# Patient Record
Sex: Male | Born: 1975 | Race: White | Hispanic: No | Marital: Married | Smoking: Former smoker
Health system: Southern US, Community
[De-identification: ages and names within clinical notes are randomized; demographics above are authoritative.]

## PROBLEM LIST (undated history)

## (undated) DIAGNOSIS — R011 Cardiac murmur, unspecified: Secondary | ICD-10-CM

## (undated) HISTORY — PX: LEG SURGERY: SHX1003

---

## 2015-07-29 HISTORY — PX: OTHER SURGICAL HISTORY: SHX169

## 2015-12-08 ENCOUNTER — Encounter (HOSPITAL_COMMUNITY)
Admission: RE | Admit: 2015-12-08 | Discharge: 2015-12-08 | Disposition: A | Discharge status: Home / Self Care | Payer: Worker's Compensation | Source: Ambulatory Visit | Attending: Orthopedic Surgery | Admitting: Orthopedic Surgery

## 2015-12-08 ENCOUNTER — Encounter (HOSPITAL_COMMUNITY): Payer: Self-pay

## 2015-12-08 DIAGNOSIS — S83242A Other tear of medial meniscus, current injury, left knee, initial encounter: Secondary | ICD-10-CM | POA: Diagnosis not present

## 2015-12-08 DIAGNOSIS — M2342 Loose body in knee, left knee: Secondary | ICD-10-CM | POA: Diagnosis not present

## 2015-12-08 DIAGNOSIS — Z87891 Personal history of nicotine dependence: Secondary | ICD-10-CM | POA: Diagnosis not present

## 2015-12-08 DIAGNOSIS — M25562 Pain in left knee: Secondary | ICD-10-CM | POA: Diagnosis not present

## 2015-12-08 HISTORY — DX: Cardiac murmur, unspecified: R01.1

## 2015-12-08 LAB — CBC
HEMATOCRIT: 42.5 % (ref 39.0–52.0)
Hemoglobin: 14.6 g/dL (ref 13.0–17.0)
MCH: 30.9 pg (ref 26.0–34.0)
MCHC: 34.4 g/dL (ref 30.0–36.0)
MCV: 89.9 fL (ref 78.0–100.0)
PLATELETS: 208 10*3/uL (ref 150–400)
RBC: 4.73 MIL/uL (ref 4.22–5.81)
RDW: 12.3 % (ref 11.5–15.5)
WBC: 8.9 10*3/uL (ref 4.0–10.5)

## 2015-12-08 NOTE — Pre-Procedure Instructions (Signed)
Isaac Rich  12/08/2015      [id number] - [place], [place] - [address] AT [facility] [address] [place] [address] Phone: [phone] Fax: [phone]    Your procedure is scheduled on Friday August 11.  Report to [facility] Admitting at 9:30 A.M.  Call this number if you have problems the morning of surgery:  [phone]   Remember:  Do not eat food or drink liquids after midnight.  Take these medicines the morning of surgery with A SIP OF WATER: NONE  7 days prior to surgery STOP taking any Aspirin, Aleve, Naproxen, Ibuprofen, Motrin, Advil, Goody's, BC's, all herbal medications, fish oil, and all vitamins    Do not wear jewelry, make-up or nail polish.  Do not wear lotions, powders, or perfumes.  You may wear deoderant.  Do not shave 48 hours prior to surgery.    Do not bring valuables to the hospital.  [facility] is not responsible for any belongings or valuables.  Contacts, dentures or bridgework may not be worn into surgery.  Leave your suitcase in the car.  After surgery it may be brought to your room.  For patients admitted to the hospital, discharge time will be determined by your treatment team.  Patients discharged the day of surgery will not be allowed to drive home.   Special instructions:    [facility]- Preparing For Surgery  Before surgery, you can play an important role. Because skin is not sterile, your skin needs to be as free of germs as possible. You can reduce the number of germs on your skin by washing with CHG (chlorahexidine gluconate) Soap before surgery.  CHG is an antiseptic cleaner which kills germs and bonds with the skin to continue killing germs even after washing.  Please do not use if you have an allergy to CHG or antibacterial soaps. If your skin becomes reddened/irritated stop using the CHG.  Do not shave (including legs and underarms) for at least 48 hours prior to first CHG shower. It is  OK to shave your face.  Please follow these instructions carefully.   1. Shower the NIGHT BEFORE SURGERY and the MORNING OF SURGERY with CHG.   2. If you chose to wash your hair, wash your hair first as usual with your normal shampoo.  3. After you shampoo, rinse your hair and body thoroughly to remove the shampoo.  4. Use CHG as you would any other liquid soap. You can apply CHG directly to the skin and wash gently with a scrungie or a clean washcloth.   5. Apply the CHG Soap to your body ONLY FROM THE NECK DOWN.  Do not use on open wounds or open sores. Avoid contact with your eyes, ears, mouth and genitals (private parts). Wash genitals (private parts) with your normal soap.  6. Wash thoroughly, paying special attention to the area where your surgery will be performed.  7. Thoroughly rinse your body with warm water from the neck down.  8. DO NOT shower/wash with your normal soap after using and rinsing off the CHG Soap.  9. Pat yourself dry with a CLEAN TOWEL.   10. Wear CLEAN PAJAMAS   11. Place CLEAN SHEETS on your bed the night of your first shower and DO NOT SLEEP WITH PETS.    Day of Surgery: Do not apply any deodorants/lotions. Please wear clean clothes to the hospital/surgery center.      Please  read over the following fact sheets that you were given. Surgical Site Infection Prevention

## 2015-12-09 MED ORDER — CEFAZOLIN SODIUM-DEXTROSE 2-4 GM/100ML-% IV SOLN
2.0000 g | INTRAVENOUS | Status: AC
  Administered 2015-12-10: 2 g via INTRAVENOUS
  Filled 2015-12-09: qty 100

## 2015-12-10 ENCOUNTER — Ambulatory Visit (HOSPITAL_COMMUNITY)
Admission: RE | Admit: 2015-12-10 | Discharge: 2015-12-10 | Disposition: A | Discharge status: Home / Self Care | Payer: Worker's Compensation | Source: Ambulatory Visit | Attending: Orthopedic Surgery | Admitting: Orthopedic Surgery

## 2015-12-10 ENCOUNTER — Encounter (HOSPITAL_COMMUNITY): Payer: Self-pay | Admitting: *Deleted

## 2015-12-10 ENCOUNTER — Ambulatory Visit (HOSPITAL_COMMUNITY): Payer: Worker's Compensation

## 2015-12-10 ENCOUNTER — Inpatient Hospital Stay (HOSPITAL_COMMUNITY): Payer: Worker's Compensation | Admitting: Anesthesiology

## 2015-12-10 ENCOUNTER — Encounter (HOSPITAL_COMMUNITY)
Admission: RE | Disposition: A | Discharge status: Home / Self Care | Payer: Self-pay | Source: Ambulatory Visit | Attending: Orthopedic Surgery

## 2015-12-10 DIAGNOSIS — Z419 Encounter for procedure for purposes other than remedying health state, unspecified: Secondary | ICD-10-CM

## 2015-12-10 DIAGNOSIS — M2342 Loose body in knee, left knee: Secondary | ICD-10-CM | POA: Insufficient documentation

## 2015-12-10 DIAGNOSIS — Z87891 Personal history of nicotine dependence: Secondary | ICD-10-CM | POA: Insufficient documentation

## 2015-12-10 DIAGNOSIS — S83242A Other tear of medial meniscus, current injury, left knee, initial encounter: Secondary | ICD-10-CM | POA: Diagnosis not present

## 2015-12-10 DIAGNOSIS — M25562 Pain in left knee: Secondary | ICD-10-CM | POA: Insufficient documentation

## 2015-12-10 HISTORY — PX: HARDWARE REMOVAL: SHX979

## 2015-12-10 HISTORY — PX: KNEE ARTHROSCOPY: SHX127

## 2015-12-10 SURGERY — ARTHROSCOPY, KNEE
Anesthesia: General | Laterality: Left

## 2015-12-10 MED ORDER — HYDROCODONE-ACETAMINOPHEN 5-325 MG PO TABS: 1.0000 | ORAL_TABLET | Freq: Four times a day (QID) | ORAL | 0 refills | Status: AC | PRN

## 2015-12-10 MED ORDER — FENTANYL CITRATE (PF) 100 MCG/2ML IJ SOLN
INTRAMUSCULAR | Status: DC | PRN
  Administered 2015-12-10: 50 ug via INTRAVENOUS
  Administered 2015-12-10 (×2): 100 ug via INTRAVENOUS

## 2015-12-10 MED ORDER — 0.9 % SODIUM CHLORIDE (POUR BTL) OPTIME
TOPICAL | Status: DC | PRN
Start: 2015-12-10 — End: 2015-12-10
  Administered 2015-12-10: 1000 mL

## 2015-12-10 MED ORDER — ROCURONIUM BROMIDE 100 MG/10ML IV SOLN
INTRAVENOUS | Status: DC | PRN
  Administered 2015-12-10: 60 mg via INTRAVENOUS
  Administered 2015-12-10: 20 mg via INTRAVENOUS

## 2015-12-10 MED ORDER — ROCURONIUM BROMIDE 10 MG/ML (PF) SYRINGE
PREFILLED_SYRINGE | INTRAVENOUS | Status: AC
  Filled 2015-12-10: qty 10

## 2015-12-10 MED ORDER — KETOROLAC TROMETHAMINE 10 MG PO TABS: 10.0000 mg | ORAL_TABLET | Freq: Four times a day (QID) | ORAL | 0 refills | Status: AC | PRN

## 2015-12-10 MED ORDER — MIDAZOLAM HCL 2 MG/2ML IJ SOLN
INTRAMUSCULAR | Status: AC
  Filled 2015-12-10: qty 2

## 2015-12-10 MED ORDER — PROPOFOL 10 MG/ML IV BOLUS
INTRAVENOUS | Status: AC
  Filled 2015-12-10: qty 20

## 2015-12-10 MED ORDER — LIDOCAINE 2% (20 MG/ML) 5 ML SYRINGE
INTRAMUSCULAR | Status: AC
  Filled 2015-12-10: qty 5

## 2015-12-10 MED ORDER — HYDROMORPHONE HCL 1 MG/ML IJ SOLN
INTRAMUSCULAR | Status: AC
  Administered 2015-12-10: 0.5 mg via INTRAVENOUS
  Filled 2015-12-10: qty 2

## 2015-12-10 MED ORDER — FENTANYL CITRATE (PF) 100 MCG/2ML IJ SOLN: 25.0000 ug | INTRAMUSCULAR | Status: DC | PRN

## 2015-12-10 MED ORDER — MIDAZOLAM HCL 5 MG/5ML IJ SOLN
INTRAMUSCULAR | Status: DC | PRN
  Administered 2015-12-10: 2 mg via INTRAVENOUS

## 2015-12-10 MED ORDER — BUPIVACAINE-EPINEPHRINE (PF) 0.25% -1:200000 IJ SOLN
INTRAMUSCULAR | Status: AC
  Filled 2015-12-10: qty 30

## 2015-12-10 MED ORDER — ONDANSETRON HCL 4 MG/2ML IJ SOLN
INTRAMUSCULAR | Status: DC | PRN
  Administered 2015-12-10: 4 mg via INTRAVENOUS

## 2015-12-10 MED ORDER — BUPIVACAINE-EPINEPHRINE 0.25% -1:200000 IJ SOLN
INTRAMUSCULAR | Status: DC | PRN
  Administered 2015-12-10: 20 mL

## 2015-12-10 MED ORDER — FENTANYL CITRATE (PF) 250 MCG/5ML IJ SOLN
INTRAMUSCULAR | Status: AC
  Filled 2015-12-10: qty 5

## 2015-12-10 MED ORDER — DEXAMETHASONE SODIUM PHOSPHATE 10 MG/ML IJ SOLN
INTRAMUSCULAR | Status: DC | PRN
  Administered 2015-12-10: 10 mg via INTRAVENOUS

## 2015-12-10 MED ORDER — GLYCOPYRROLATE 0.2 MG/ML IJ SOLN
INTRAMUSCULAR | Status: DC | PRN
Start: 2015-12-10 — End: 2015-12-10
  Administered 2015-12-10: 0.4 mg via INTRAVENOUS

## 2015-12-10 MED ORDER — DEXAMETHASONE SODIUM PHOSPHATE 10 MG/ML IJ SOLN
INTRAMUSCULAR | Status: AC
  Filled 2015-12-10: qty 1

## 2015-12-10 MED ORDER — PROMETHAZINE HCL 25 MG/ML IJ SOLN: 6.2500 mg | INTRAMUSCULAR | Status: DC | PRN

## 2015-12-10 MED ORDER — CHLORHEXIDINE GLUCONATE 4 % EX LIQD: 60.0000 mL | Freq: Once | CUTANEOUS | Status: DC

## 2015-12-10 MED ORDER — HYDROMORPHONE HCL 1 MG/ML IJ SOLN
0.5000 mg | INTRAMUSCULAR | Status: DC | PRN
  Administered 2015-12-10 (×2): 0.5 mg via INTRAVENOUS

## 2015-12-10 MED ORDER — PROMETHAZINE HCL 12.5 MG PO TABS
12.5000 mg | ORAL_TABLET | Freq: Four times a day (QID) | ORAL | 0 refills | Status: AC | PRN
Start: 2015-12-10 — End: ?

## 2015-12-10 MED ORDER — STERILE WATER FOR IRRIGATION IR SOLN
Status: DC | PRN
  Administered 2015-12-10: 1000 mL

## 2015-12-10 MED ORDER — NEOSTIGMINE METHYLSULFATE 10 MG/10ML IV SOLN
INTRAVENOUS | Status: DC | PRN
  Administered 2015-12-10: 3 mg via INTRAVENOUS

## 2015-12-10 MED ORDER — LACTATED RINGERS IV SOLN
INTRAVENOUS | Status: DC
  Administered 2015-12-10 (×2): via INTRAVENOUS

## 2015-12-10 MED ORDER — PROPOFOL 10 MG/ML IV BOLUS
INTRAVENOUS | Status: DC | PRN
  Administered 2015-12-10: 200 mg via INTRAVENOUS

## 2015-12-10 MED ORDER — SODIUM CHLORIDE 0.9 % IR SOLN
Status: DC | PRN
  Administered 2015-12-10 (×2): 3000 mL

## 2015-12-10 MED ORDER — LIDOCAINE HCL (CARDIAC) 20 MG/ML IV SOLN
INTRAVENOUS | Status: DC | PRN
  Administered 2015-12-10: 80 mg via INTRAVENOUS

## 2015-12-10 MED ORDER — ONDANSETRON HCL 4 MG/2ML IJ SOLN
INTRAMUSCULAR | Status: AC
  Filled 2015-12-10: qty 2

## 2015-12-10 SURGICAL SUPPLY — 78 items
BANDAGE ACE 6X5 VEL STRL LF (GAUZE/BANDAGES/DRESSINGS) ×3 IMPLANT
BANDAGE ELASTIC 4 VELCRO ST LF (GAUZE/BANDAGES/DRESSINGS) ×3 IMPLANT
BANDAGE ELASTIC 6 VELCRO ST LF (GAUZE/BANDAGES/DRESSINGS) ×6 IMPLANT
BANDAGE ESMARK 6X9 LF (GAUZE/BANDAGES/DRESSINGS) ×1 IMPLANT
BIT DRILL AO GAMMA 4.2X180 (BIT) ×3 IMPLANT
BLADE CUDA 5.5 (BLADE) IMPLANT
BLADE GREAT WHITE 4.2 (BLADE) ×2 IMPLANT
BLADE GREAT WHITE 4.2MM (BLADE) ×1
BLADE SURG 11 STRL SS (BLADE) ×3 IMPLANT
BNDG COHESIVE 6X5 TAN STRL LF (GAUZE/BANDAGES/DRESSINGS) ×3 IMPLANT
BNDG ESMARK 6X9 LF (GAUZE/BANDAGES/DRESSINGS) ×3
BNDG GAUZE ELAST 4 BULKY (GAUZE/BANDAGES/DRESSINGS) ×6 IMPLANT
BRUSH SCRUB DISP (MISCELLANEOUS) ×6 IMPLANT
CLEANER TIP ELECTROSURG 2X2 (MISCELLANEOUS) IMPLANT
CLOSURE WOUND 1/2 X4 (GAUZE/BANDAGES/DRESSINGS)
COVER SURGICAL LIGHT HANDLE (MISCELLANEOUS) ×3 IMPLANT
CUFF TOURNIQUET SINGLE 34IN LL (TOURNIQUET CUFF) ×3 IMPLANT
DRAPE ARTHROSCOPY W/POUCH 114 (DRAPES) ×3 IMPLANT
DRAPE C-ARM 42X72 X-RAY (DRAPES) ×3 IMPLANT
DRAPE C-ARMOR (DRAPES) ×3 IMPLANT
DRAPE INCISE IOBAN 66X45 STRL (DRAPES) ×3 IMPLANT
DRAPE OEC MINIVIEW 54X84 (DRAPES) IMPLANT
DRAPE U-SHAPE 47X51 STRL (DRAPES) ×3 IMPLANT
DRSG ADAPTIC 3X8 NADH LF (GAUZE/BANDAGES/DRESSINGS) ×3 IMPLANT
DRSG EMULSION OIL 3X3 NADH (GAUZE/BANDAGES/DRESSINGS) ×3 IMPLANT
DRSG PAD ABDOMINAL 8X10 ST (GAUZE/BANDAGES/DRESSINGS) ×3 IMPLANT
ELECT REM PT RETURN 9FT ADLT (ELECTROSURGICAL) ×3
ELECTRODE REM PT RTRN 9FT ADLT (ELECTROSURGICAL) ×1 IMPLANT
EVACUATOR 1/8 PVC DRAIN (DRAIN) IMPLANT
GAUZE SPONGE 4X4 12PLY STRL (GAUZE/BANDAGES/DRESSINGS) ×3 IMPLANT
GAUZE SPONGE 4X4 16PLY XRAY LF (GAUZE/BANDAGES/DRESSINGS) ×3 IMPLANT
GLOVE BIO SURGEON STRL SZ7.5 (GLOVE) ×3 IMPLANT
GLOVE BIO SURGEON STRL SZ8 (GLOVE) ×3 IMPLANT
GLOVE BIOGEL PI IND STRL 7.5 (GLOVE) ×1 IMPLANT
GLOVE BIOGEL PI IND STRL 8 (GLOVE) ×1 IMPLANT
GLOVE BIOGEL PI INDICATOR 7.5 (GLOVE) ×2
GLOVE BIOGEL PI INDICATOR 8 (GLOVE) ×2
GOWN STRL REUS W/ TWL LRG LVL3 (GOWN DISPOSABLE) ×2 IMPLANT
GOWN STRL REUS W/ TWL XL LVL3 (GOWN DISPOSABLE) ×1 IMPLANT
GOWN STRL REUS W/TWL LRG LVL3 (GOWN DISPOSABLE) ×4
GOWN STRL REUS W/TWL XL LVL3 (GOWN DISPOSABLE) ×2
KIT BASIN OR (CUSTOM PROCEDURE TRAY) ×3 IMPLANT
KIT ROOM TURNOVER OR (KITS) ×3 IMPLANT
MANIFOLD NEPTUNE II (INSTRUMENTS) ×3 IMPLANT
MARKER SKIN DUAL TIP RULER LAB (MISCELLANEOUS) ×3 IMPLANT
NEEDLE 22X1 1/2 (OR ONLY) (NEEDLE) IMPLANT
NS IRRIG 1000ML POUR BTL (IV SOLUTION) ×3 IMPLANT
PACK ARTHROSCOPY DSU (CUSTOM PROCEDURE TRAY) ×3 IMPLANT
PACK ORTHO EXTREMITY (CUSTOM PROCEDURE TRAY) ×3 IMPLANT
PAD ARMBOARD 7.5X6 YLW CONV (MISCELLANEOUS) ×6 IMPLANT
PADDING CAST COTTON 6X4 STRL (CAST SUPPLIES) ×3 IMPLANT
SET ARTHROSCOPY TUBING (MISCELLANEOUS) ×2
SET ARTHROSCOPY TUBING LN (MISCELLANEOUS) ×1 IMPLANT
SPONGE GAUZE 4X4 12PLY STER LF (GAUZE/BANDAGES/DRESSINGS) ×3 IMPLANT
SPONGE LAP 18X18 X RAY DECT (DISPOSABLE) ×6 IMPLANT
SPONGE SCRUB IODOPHOR (GAUZE/BANDAGES/DRESSINGS) ×3 IMPLANT
STAPLER VISISTAT 35W (STAPLE) ×3 IMPLANT
STOCKINETTE IMPERVIOUS LG (DRAPES) IMPLANT
STRIP CLOSURE SKIN 1/2X4 (GAUZE/BANDAGES/DRESSINGS) IMPLANT
SUCTION FRAZIER HANDLE 10FR (MISCELLANEOUS)
SUCTION TUBE FRAZIER 10FR DISP (MISCELLANEOUS) IMPLANT
SUT ETHILON 3 0 PS 1 (SUTURE) ×3 IMPLANT
SUT ETHILON 4 0 PS 2 18 (SUTURE) ×3 IMPLANT
SUT PDS AB 2-0 CT1 27 (SUTURE) IMPLANT
SUT VIC AB 0 CT1 27 (SUTURE)
SUT VIC AB 0 CT1 27XBRD ANBCTR (SUTURE) IMPLANT
SUT VIC AB 2-0 CT1 27 (SUTURE)
SUT VIC AB 2-0 CT1 TAPERPNT 27 (SUTURE) IMPLANT
SYR BULB IRRIGATION 50ML (SYRINGE) ×3 IMPLANT
SYR CONTROL 10ML LL (SYRINGE) IMPLANT
TOWEL OR 17X24 6PK STRL BLUE (TOWEL DISPOSABLE) ×6 IMPLANT
TOWEL OR 17X26 10 PK STRL BLUE (TOWEL DISPOSABLE) ×6 IMPLANT
TUBE CONNECTING 12'X1/4 (SUCTIONS)
TUBE CONNECTING 12X1/4 (SUCTIONS) IMPLANT
UNDERPAD 30X30 INCONTINENT (UNDERPADS AND DIAPERS) ×6 IMPLANT
WAND HAND CNTRL MULTIVAC 90 (MISCELLANEOUS) IMPLANT
WATER STERILE IRR 1000ML POUR (IV SOLUTION) ×3 IMPLANT
YANKAUER SUCT BULB TIP NO VENT (SUCTIONS) ×3 IMPLANT

## 2015-12-10 NOTE — Progress Notes (Signed)
Orthopedic Tech Progress Note Patient Details:  Isaac Rich 12/10/1975 [id number]  Ortho Devices Type of Ortho Device: Crutches Ortho Device/Splint Interventions: Ordered, Adjustment   Isaac Rich, Isaac Rich 12/10/2015, 5:00 PM

## 2015-12-10 NOTE — Anesthesia Preprocedure Evaluation (Addendum)
Anesthesia Evaluation  Patient identified by MRN, date of birth, ID band Patient awake    Reviewed: Allergy & Precautions, NPO status , Patient's Chart, lab work & pertinent test results  History of Anesthesia Complications Negative for: history of anesthetic complications  Airway Mallampati: III  TM Distance: >3 FB Neck ROM: Full    Dental  (+) Dental Advisory Given Poor dentition. No loose teeth.:   Pulmonary neg shortness of breath, neg sleep apnea, neg COPD, neg recent URI, former smoker,    Pulmonary exam normal breath sounds clear to auscultation       Cardiovascular Exercise Tolerance: Good (-) hypertension(-) angina(-) CAD, (-) Past MI, (-) Cardiac Stents, (-) CABG and (-) Orthopnea  Rhythm:Regular Rate:Normal - Systolic murmurs H/o heart murmur as a child. No longer present.   Neuro/Psych negative neurological ROS     GI/Hepatic negative GI ROS, Neg liver ROS,   Endo/Other  negative endocrine ROS  Renal/GU negative Renal ROS     Musculoskeletal   Abdominal (+) - obese,   Peds  Hematology negative hematology ROS (+)   Anesthesia Other Findings   Reproductive/Obstetrics                            Anesthesia Physical Anesthesia Plan  ASA: I  Anesthesia Plan: General   Post-op Pain Management:    Induction: Intravenous  Airway Management Planned: Oral ETT  Additional Equipment:   Intra-op Plan:   Post-operative Plan: Extubation in OR  Informed Consent: I have reviewed the patients History and Physical, chart, labs and discussed the procedure including the risks, benefits and alternatives for the proposed anesthesia with the patient or authorized representative who has indicated his/her understanding and acceptance.   Dental advisory given  Plan Discussed with:   Anesthesia Plan Comments:       Anesthesia Quick Evaluation

## 2015-12-10 NOTE — Transfer of Care (Signed)
Immediate Anesthesia Transfer of Care Note  Patient: Isaac Rich  Procedure(s) Performed: Procedure(s): ARTHROSCOPY KNEE (Left) HARDWARE REMOVAL LEFT FEMUR (Left)  Patient Location: PACU  Anesthesia Type:General  Level of Consciousness: awake, alert , oriented and patient cooperative  Airway & Oxygen Therapy: Patient Spontanous Breathing and Patient connected to nasal cannula oxygen  Post-op Assessment: Report given to RN and Post -op Vital signs reviewed and stable  Post vital signs: Reviewed and stable  Last Vitals:  Vitals:   12/10/15 0945  BP: 129/67  Pulse: 60  Resp: 18  Temp: 36.7 C    Last Pain:  Vitals:   12/10/15 0945  TempSrc: Oral         Complications: No apparent anesthesia complications

## 2015-12-10 NOTE — Anesthesia Procedure Notes (Signed)
Procedure Name: Intubation Date/Time: 12/10/2015 1:10 PM Performed by: Army FossaPULLIAM, Zebulin Siegel DANE Pre-anesthesia Checklist: Patient identified, Emergency Drugs available, Suction available and Patient being monitored Patient Re-evaluated:Patient Re-evaluated prior to inductionOxygen Delivery Method: Circle System Utilized Preoxygenation: Pre-oxygenation with 100% oxygen Intubation Type: IV induction Ventilation: Mask ventilation without difficulty Laryngoscope Size: Mac and 4 Grade View: Grade II Tube type: Oral Tube size: 7.0 mm Number of attempts: 1 Airway Equipment and Method: Stylet and Oral airway Placement Confirmation: ETT inserted through vocal cords under direct vision,  positive ETCO2 and breath sounds checked- equal and bilateral Secured at: 23 cm Tube secured with: Tape Dental Injury: Teeth and Oropharynx as per pre-operative assessment
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
## 2015-12-10 NOTE — Discharge Instructions (Signed)
Orthopaedic Trauma Service Discharge Instructions   General Discharge Instructions  WEIGHT BEARING STATUS: weightbearing as tolerated left lower extremity   RANGE OF MOTION/ACTIVITY: range of motion as tolerated Left knee and hip   Wound Care: daily dressing changes starting on 12/12/2015. See instructions below    Discharge Wound Care Instructions  Do NOT apply any ointments, solutions or lotions to pin sites or surgical wounds.  These prevent needed drainage and even though solutions like hydrogen peroxide kill bacteria, they also damage cells lining the pin sites that help fight infection.  Applying lotions or ointments can keep the wounds moist and can cause them to breakdown and open up as well. This can increase the risk for infection. When in doubt call the office.  Surgical incisions should be dressed daily.  If any drainage is noted, use one layer of adaptic, then gauze, Kerlix, and an ace wrap.  Once the incision is completely dry and without drainage, it may be left open to air out.  Showering may begin 36-48 hours later.  Cleaning gently with soap and water.  Traumatic wounds should be dressed daily as well.    One layer of adaptic, gauze, Kerlix, then ace wrap.  The adaptic can be discontinued once the draining has ceased    If you have a wet to dry dressing: wet the gauze with saline the squeeze as much saline out so the gauze is moist (not soaking wet), place moistened gauze over wound, then place a dry gauze over the moist one, followed by Kerlix wrap, then ace wrap.   PAIN MEDICATION USE AND EXPECTATIONS  You have likely been given narcotic medications to help control your pain.  After a traumatic event that results in an fracture (broken bone) with or without surgery, it is ok to use narcotic pain medications to help control one's pain.  We understand that everyone responds to pain differently and each individual patient will be evaluated on a regular basis for the  continued need for narcotic medications. Ideally, narcotic medication use should last no more than 6-8 weeks (coinciding with fracture healing).   As a patient it is your responsibility as well to monitor narcotic medication use and report the amount and frequency you use these medications when you come to your office visit.   We would also advise that if you are using narcotic medications, you should take a dose prior to therapy to maximize you participation.  IF YOU ARE ON NARCOTIC MEDICATIONS IT IS NOT PERMISSIBLE TO OPERATE A MOTOR VEHICLE (MOTORCYCLE/CAR/TRUCK/MOPED) OR HEAVY MACHINERY DO NOT MIX NARCOTICS WITH OTHER CNS (CENTRAL NERVOUS SYSTEM) DEPRESSANTS SUCH AS ALCOHOL  Diet: as you were eating previously.  Can use over the counter stool softeners and bowel preparations, such as Miralax, to help with bowel movements.  Narcotics can be constipating.  Be sure to drink plenty of fluids    STOP SMOKING OR USING NICOTINE PRODUCTS!!!!  As discussed nicotine severely impairs your body's ability to heal surgical and traumatic wounds but also impairs bone healing.  Wounds and bone heal by forming microscopic blood vessels (angiogenesis) and nicotine is a vasoconstrictor (essentially, shrinks blood vessels).  Therefore, if vasoconstriction occurs to these microscopic blood vessels they essentially disappear and are unable to deliver necessary nutrients to the healing tissue.  This is one modifiable factor that you can do to dramatically increase your chances of healing your injury.    (This means no smoking, no nicotine gum, patches, etc)  DO NOT USE  NONSTEROIDAL ANTI-INFLAMMATORY DRUGS (NSAID'S)  Using products such as Advil (ibuprofen), Aleve (naproxen), Motrin (ibuprofen) for additional pain control during fracture healing can delay and/or prevent the healing response.  If you would like to take over the counter (OTC) medication, Tylenol (acetaminophen) is ok.  However, some narcotic medications  that are given for pain control contain acetaminophen as well. Therefore, you should not exceed more than 4000 mg of tylenol in a day if you do not have liver disease.  Also note that there are may OTC medicines, such as cold medicines and allergy medicines that my contain tylenol as well.  If you have any questions about medications and/or interactions please ask your doctor/PA or your pharmacist.      ICE AND ELEVATE INJURED/OPERATIVE EXTREMITY  Using ice and elevating the injured extremity above your heart can help with swelling and pain control.  Icing in a pulsatile fashion, such as 20 minutes on and 20 minutes off, can be followed.    Do not place ice directly on skin. Make sure there is a barrier between to skin and the ice pack.    Using frozen items such as frozen peas works well as the conform nicely to the are that needs to be iced.  USE AN ACE WRAP OR TED HOSE FOR SWELLING CONTROL  In addition to icing and elevation, Ace wraps or TED hose are used to help limit and resolve swelling.  It is recommended to use Ace wraps or TED hose until you are informed to stop.    When using Ace Wraps start the wrapping distally (farthest away from the body) and wrap proximally (closer to the body)   Example: If you had surgery on your leg or thing and you do not have a splint on, start the ace wrap at the toes and work your way up to the thigh        If you had surgery on your upper extremity and do not have a splint on, start the ace wrap at your fingers and work your way up to the upper arm  IF YOU ARE IN A SPLINT OR CAST DO NOT REMOVE IT FOR ANY REASON   If your splint gets wet for any reason please contact the office immediately. You may shower in your splint or cast as long as you keep it dry.  This can be done by wrapping in a cast cover or garbage back (or similar)  Do Not stick any thing down your splint or cast such as pencils, money, or hangers to try and scratch yourself with.  If you feel  itchy take benadryl as prescribed on the bottle for itching  IF YOU ARE IN A CAM BOOT (BLACK BOOT)  You may remove boot periodically. Perform daily dressing changes as noted below.  Wash the liner of the boot regularly and wear a sock when wearing the boot. It is recommended that you sleep in the boot until told otherwise  CALL THE OFFICE WITH ANY QUESTIONS OR CONCERNS: [phone]

## 2015-12-10 NOTE — Brief Op Note (Signed)
12/10/2015  3:56 PM  PATIENT:  Isaac Rich  40 y.o. male  PRE-OPERATIVE DIAGNOSIS:  LEFT KNEE PAIN,SYMPOMATIC  HARDWARE LEFT FEMUR  POST-OPERATIVE DIAGNOSIS:  LEFT KNEE PAIN,SYMPOMATIC  HARDWARE LEFT FEMUR  PROCEDURE:  Procedure(s): 1. ARTHROSCOPY KNEE (Left) with PARTIAL MEDIAL MENISCECTOMY 2. REMOVAL OF LOOSE BODY 15MM X 8 MM 3. CHONDROPLASTY OF PATELLA 4. HARDWARE REMOVAL LEFT FEMUR (Left) INCLUDING BROKEN SCREW  SURGEON:  Surgeon(s) and Role:    * Isaac GalasMichael Katryn Plummer, MD - Primary  PHYSICIAN ASSISTANT: NONE  ANESTHESIA:   general  EBL:  Total I/O In: 1000 [I.V.:1000] Out: -   BLOOD ADMINISTERED:none  DRAINS: none   LOCAL MEDICATIONS USED:  MARCAINE     SPECIMEN:  Source of Specimen:  LOOSE BODY  DISPOSITION OF SPECIMEN:  PATHOLOGY  COUNTS:  YES  TOURNIQUET:   Total Tourniquet Time Documented: Thigh (Left) - 29 minutes Total: Thigh (Left) - 29 minutes   DICTATION: .Other Dictation: Dictation Number [id number]  PLAN OF CARE: Discharge to home after PACU  PATIENT DISPOSITION:  PACU - hemodynamically stable.   Delay start of Pharmacological VTE agent (>24hrs) due to surgical blood loss or risk of bleeding: no

## 2015-12-13 ENCOUNTER — Encounter (HOSPITAL_COMMUNITY): Payer: Self-pay | Admitting: Orthopedic Surgery

## 2015-12-13 NOTE — Anesthesia Postprocedure Evaluation (Signed)
Anesthesia Post Note  Patient: Isaac Rich  Procedure(s) Performed: Procedure(s) (LRB): ARTHROSCOPY KNEE (Left) HARDWARE REMOVAL LEFT FEMUR (Left)  Patient location during evaluation: PACU Anesthesia Type: General Level of consciousness: sedated and patient cooperative Pain management: pain level controlled Vital Signs Assessment: post-procedure vital signs reviewed and stable Respiratory status: spontaneous breathing Cardiovascular status: stable Anesthetic complications: no    Last Vitals:  Vitals:   12/10/15 1622 12/10/15 1636  BP: 124/81 121/84  Pulse: (!) 49 (!) 57  Resp: 11 11  Temp:      Last Pain:  Vitals:   12/10/15 1645  TempSrc:   PainSc: 3                  Isaac Rich
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
## 2017-10-12 IMAGING — RF DG C-ARM 61-120 MIN
1 series · 3 of 3 positions shown · non-contrast
Comparison: None.

CLINICAL DATA: Left femur hardware removal

EXAM:
LEFT FEMUR 2 VIEWS; DG C-ARM 61-120 MIN

[Series 1: run · 3 of 3 slices shown]
[im 1/3]
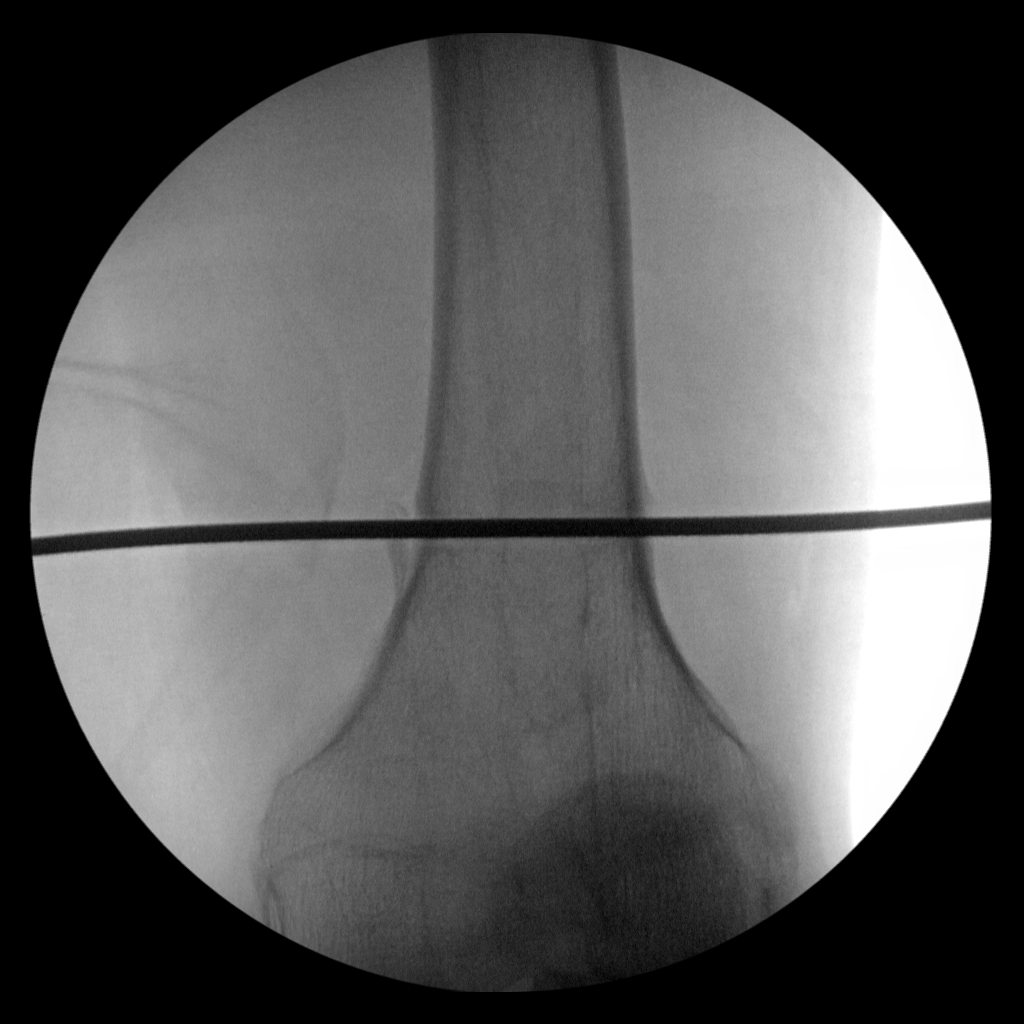
[im 2/3]
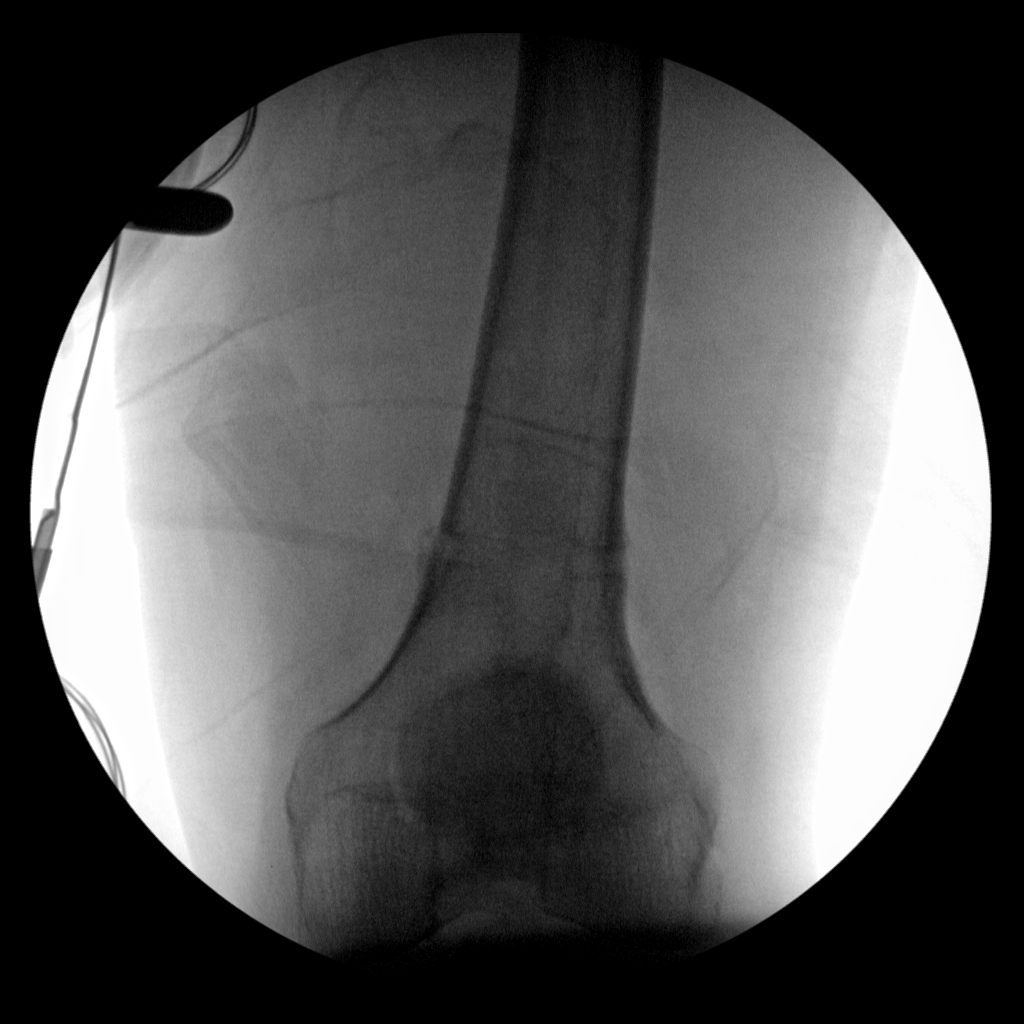
[im 3/3]
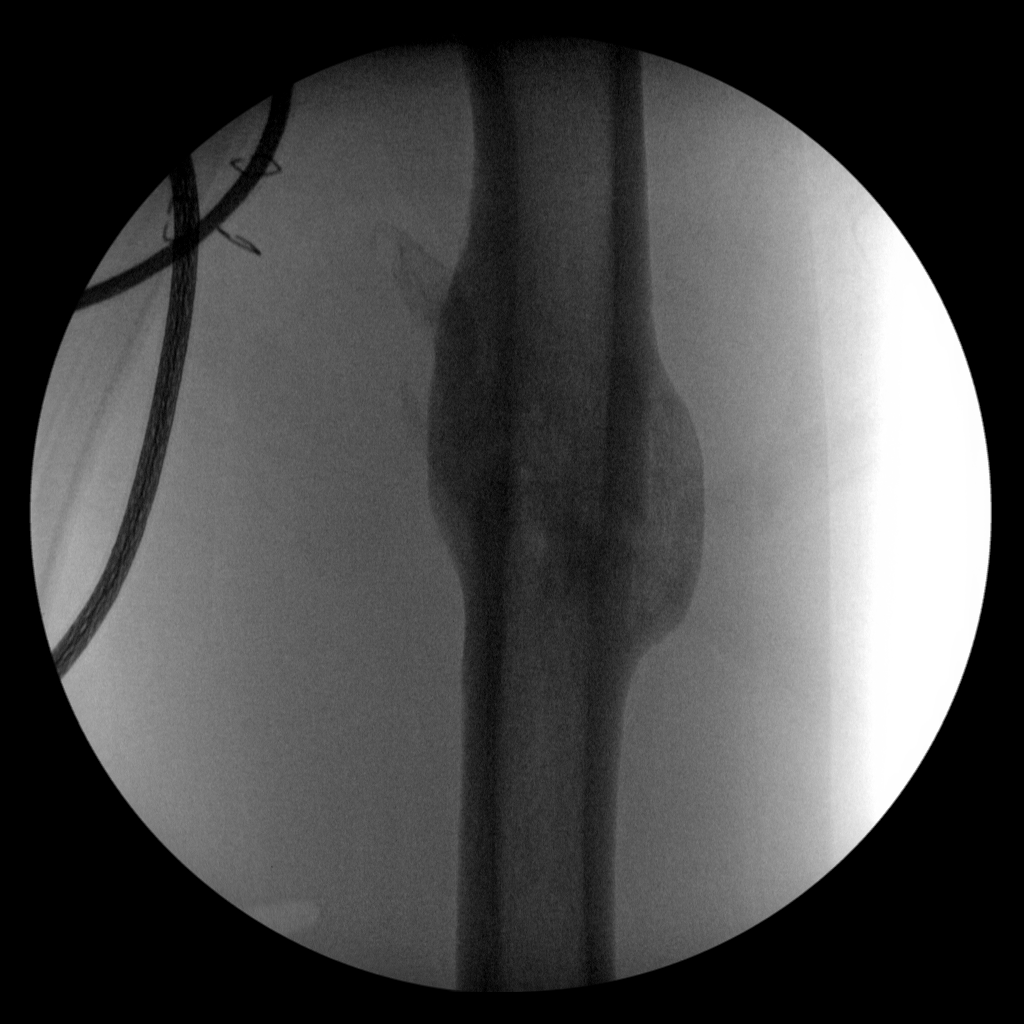

[3 of 3 positions shown; findings below may reference images not displayed]

FINDINGS: Three views of mid and distal left femur submitted. A transverse
metallic rod in distal femoral shaft has been removed. Callus
formation noted in mid femoral shaft. There is anatomic alignment.
No acute fracture.
IMPRESSION: Status post hardware removal in distal left femur. There is callus
formation in mid femoral shaft with anatomic alignment.

Fluoroscopy time 1 minutes 3 seconds. Please see the operative
report.
